# Patient Record
Sex: Male | Born: 1965 | Hispanic: No | Smoking: Never smoker
Health system: Southern US, Community
[De-identification: ages and names within clinical notes are randomized; demographics above are authoritative.]

---

## 2009-01-27 ENCOUNTER — Encounter: Admission: RE | Admit: 2009-01-27 | Discharge: 2009-01-27 | Payer: Self-pay | Admitting: Gastroenterology

## 2009-02-16 ENCOUNTER — Ambulatory Visit (HOSPITAL_COMMUNITY): Admission: RE | Admit: 2009-02-16 | Discharge: 2009-02-16 | Payer: Self-pay | Admitting: Gastroenterology

## 2011-01-03 IMAGING — NM NM HEPATO W/GB/PHARM/[PERSON_NAME]
2 series · 12 of 12 positions shown · non-contrast
Comparison: None.

CLINICAL DATA: Upper abdominal pain.

NUCLEAR MEDICINE HEPATOBILIARY IMAGING WITH GALLBLADDER EF
TECHNIQUE: Sequential images of the abdomen were obtained [DATE] minutes following intravenous administration of
radiopharmaceutical.  After slow intravenous infusion of 1.8 uCg
Cholecystokinin, gallbladder ejection fraction was determined.
Radiopharmaceutical:  5.2 mCi Choletec IV

[Series 1: he hepato · 4.71mm/px · 6 of 30 frames shown (1 of 2)]
[frame 3/30]
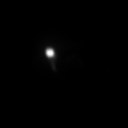
[frame 8/30]
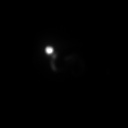
[frame 13/30]
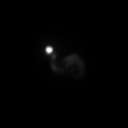
[frame 18/30]
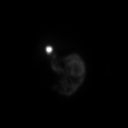
[frame 23/30]
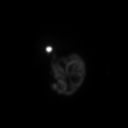
[frame 28/30]
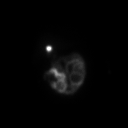

[Series 1: he hepato · 4.71mm/px · 6 of 60 frames shown (2 of 2)]
[frame 6/60]
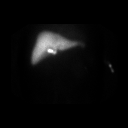
[frame 16/60]
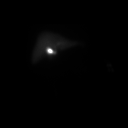
[frame 26/60]
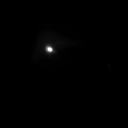
[frame 36/60]
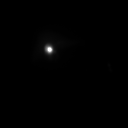
[frame 46/60]
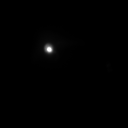
[frame 56/60]
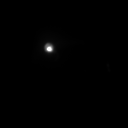

[12 of 12 positions shown; findings below may reference images not displayed]

FINDINGS: Rapid uptake of radionuclide by the liver.  Timely
appearance of the gallbladder and biliary ducts.  Small bowel
activity was noted subsequently.  The gallbladder ejection fraction
is 81.6%.  Normal ejection fraction is greater than 30%.

The patient did not experience symptoms during CCK infusion.
IMPRESSION: Patency of the cystic and biliary ducts. GB EF= 81.6%.

## 2021-11-10 ENCOUNTER — Encounter (HOSPITAL_BASED_OUTPATIENT_CLINIC_OR_DEPARTMENT_OTHER): Payer: Self-pay | Admitting: Emergency Medicine

## 2021-11-10 ENCOUNTER — Emergency Department (HOSPITAL_BASED_OUTPATIENT_CLINIC_OR_DEPARTMENT_OTHER): Payer: BC Managed Care – PPO

## 2021-11-10 ENCOUNTER — Emergency Department (HOSPITAL_BASED_OUTPATIENT_CLINIC_OR_DEPARTMENT_OTHER)
Admission: EM | Admit: 2021-11-10 | Discharge: 2021-11-10 | Disposition: A | Discharge status: Home / Self Care | Payer: BC Managed Care – PPO | Attending: Emergency Medicine | Admitting: Emergency Medicine

## 2021-11-10 ENCOUNTER — Other Ambulatory Visit: Payer: Self-pay

## 2021-11-10 DIAGNOSIS — R339 Retention of urine, unspecified: Secondary | ICD-10-CM | POA: Diagnosis present

## 2021-11-10 DIAGNOSIS — N400 Enlarged prostate without lower urinary tract symptoms: Secondary | ICD-10-CM | POA: Insufficient documentation

## 2021-11-10 LAB — CBC WITH DIFFERENTIAL/PLATELET
Abs Immature Granulocytes: 0.02 10*3/uL (ref 0.00–0.07)
Basophils Absolute: 0 10*3/uL (ref 0.0–0.1)
Basophils Relative: 1 %
Eosinophils Absolute: 0 10*3/uL (ref 0.0–0.5)
Eosinophils Relative: 1 %
HCT: 40.2 % (ref 39.0–52.0)
Hemoglobin: 14.3 g/dL (ref 13.0–17.0)
Immature Granulocytes: 1 %
Lymphocytes Relative: 19 %
Lymphs Abs: 0.8 10*3/uL (ref 0.7–4.0)
MCH: 30.4 pg (ref 26.0–34.0)
MCHC: 35.6 g/dL (ref 30.0–36.0)
MCV: 85.4 fL (ref 80.0–100.0)
Monocytes Absolute: 0.2 10*3/uL (ref 0.1–1.0)
Monocytes Relative: 5 %
Neutro Abs: 2.9 10*3/uL (ref 1.7–7.7)
Neutrophils Relative %: 73 %
Platelets: 217 10*3/uL (ref 150–400)
RBC: 4.71 MIL/uL (ref 4.22–5.81)
RDW: 13 % (ref 11.5–15.5)
WBC: 3.9 10*3/uL — ABNORMAL LOW (ref 4.0–10.5)
nRBC: 0 % (ref 0.0–0.2)

## 2021-11-10 LAB — URINALYSIS, ROUTINE W REFLEX MICROSCOPIC
Bilirubin Urine: NEGATIVE
Glucose, UA: NEGATIVE mg/dL
Ketones, ur: NEGATIVE mg/dL
Leukocytes,Ua: NEGATIVE
Nitrite: NEGATIVE
Protein, ur: NEGATIVE mg/dL
Specific Gravity, Urine: 1.015 (ref 1.005–1.030)
pH: 6.5 (ref 5.0–8.0)

## 2021-11-10 LAB — URINALYSIS, MICROSCOPIC (REFLEX)

## 2021-11-10 LAB — BASIC METABOLIC PANEL
Anion gap: 6 (ref 5–15)
BUN: 15 mg/dL (ref 6–20)
CO2: 24 mmol/L (ref 22–32)
Calcium: 9.2 mg/dL (ref 8.9–10.3)
Chloride: 110 mmol/L (ref 98–111)
Creatinine, Ser: 0.85 mg/dL (ref 0.61–1.24)
GFR, Estimated: >60 mL/min (ref >60)
Glucose, Bld: 126 mg/dL — ABNORMAL HIGH (ref 70–99)
Potassium: 3.7 mmol/L (ref 3.5–5.1)
Sodium: 140 mmol/L (ref 135–145)

## 2021-11-10 MED ORDER — TAMSULOSIN HCL 0.4 MG PO CAPS: 0.4000 mg | ORAL_CAPSULE | Freq: Every day | ORAL | 0 refills | Status: AC

## 2021-11-10 MED ORDER — TAMSULOSIN HCL 0.4 MG PO CAPS
0.4000 mg | ORAL_CAPSULE | Freq: Once | ORAL | Status: AC
  Administered 2021-11-10: 0.4 mg via ORAL
  Filled 2021-11-10: qty 1

## 2021-11-10 MED ORDER — CEFPODOXIME PROXETIL 200 MG PO TABS: 200.0000 mg | ORAL_TABLET | Freq: Two times a day (BID) | ORAL | 0 refills | Status: AC

## 2021-11-10 MED ORDER — CEFPODOXIME PROXETIL 200 MG PO TABS: 200.0000 mg | ORAL_TABLET | Freq: Two times a day (BID) | ORAL | 0 refills | Status: DC

## 2021-11-10 NOTE — ED Provider Notes (Signed)
[facility] EMERGENCY DEPARTMENT Provider Note   CSN: [id number] Arrival date & time: 11/10/21  4098     History  Chief Complaint  Patient presents with   Urinary Retention    Aaron Lowe is a 56 y.o. male.  With no reported significant past medical history who presents with polyuria and urinary retention.  He says normally he typically goes to the bathroom about 3 times a night.  He says he has a weak stream but typically feels like he is fully emptying his bladder.  Last night he tried to go the bathroom multiple times but felt like he was not able to empty or pass urine.  He denied any dysuria or hematuria or concern for STD or infection.  He has had no fevers, no back pain, no vomiting, no leg pain or weakness or recent injury.  He did have increasing lower abdominal pain with inability urinate but since getting a Foley catheter placed in the ER he feels significant relief.  He denies any history of BPH but also also never been worked up for his prostate or seen a doctor for prostate.  No history of kidney stones.  HPI     Home Medications Prior to Admission medications   Medication Sig Start Date End Date Taking? Authorizing Provider  tamsulosin (FLOMAX) 0.4 MG CAPS capsule Take 1 capsule (0.4 mg total) by mouth daily after supper for 20 days. 11/10/21 11/30/21 Yes Mardene Sayer, MD  cefpodoxime (VANTIN) 200 MG tablet Take 1 tablet (200 mg total) by mouth 2 (two) times daily for 7 days. 11/10/21 11/17/21  Mardene Sayer, MD      Allergies    Patient has no known allergies.    Review of Systems   Review of Systems  Physical Exam Updated Vital Signs BP 136/87   Pulse 75   Temp 98.3 F (36.8 C) (Oral)   Resp 16   SpO2 96%  Physical Exam Constitutional: Alert and oriented. Well appearing and in no distress. Eyes: Conjunctivae are normal. ENT      Head: Normocephalic and atraumatic.      Nose: No congestion.      Mouth/Throat: Mucous membranes are  moist.      Neck: No stridor. Cardiovascular: S1, S2,  Normal and symmetric distal pulses are present in all extremities.Warm and well perfused. Respiratory: Normal respiratory effort. Breath sounds are normal. Gastrointestinal: Soft and mildly distended but nontender, no CVA tenderness, Foley catheter in place draining clear yellow urine Musculoskeletal: Normal range of motion in all extremities. Neurologic: Normal speech and language.  Full strength bilateral lower extremities.  Sensation grossly intact.  Steady gait.  No gross focal neurologic deficits are appreciated. Skin: Skin is warm, dry and intact. No rash noted. Psychiatric: Mood and affect are normal. Speech and behavior are normal.  ED Results / Procedures / Treatments   Labs (all labs ordered are listed, but only abnormal results are displayed) Labs Reviewed  URINALYSIS, ROUTINE W REFLEX MICROSCOPIC - Abnormal; Notable for the following components:      Result Value   Hgb urine dipstick SMALL (*)    All other components within normal limits  BASIC METABOLIC PANEL - Abnormal; Notable for the following components:   Glucose, Bld 126 (*)    All other components within normal limits  CBC WITH DIFFERENTIAL/PLATELET - Abnormal; Notable for the following components:   WBC 3.9 (*)    All other components within normal limits  URINALYSIS, MICROSCOPIC (REFLEX) -  Abnormal; Notable for the following components:   Bacteria, UA RARE (*)    All other components within normal limits    EKG None  Radiology CT Renal Stone Study  Result Date: 11/10/2021 CLINICAL DATA:  Flank pain. Kidney stone suspected. New retention. Hematuria. EXAM: CT ABDOMEN AND PELVIS WITHOUT CONTRAST TECHNIQUE: Multidetector CT imaging of the abdomen and pelvis was performed following the standard protocol without IV contrast. RADIATION DOSE REDUCTION: This exam was performed according to the departmental dose-optimization program which includes automated exposure  control, adjustment of the mA and/or kV according to patient size and/or use of iterative reconstruction technique. COMPARISON:  None Available. FINDINGS: Lower chest: No acute abnormality. Hepatobiliary: No focal liver abnormality is seen. No gallstones, gallbladder wall thickening, or biliary dilatation. Pancreas: Unremarkable. No pancreatic ductal dilatation or surrounding inflammatory changes. Spleen: Normal in size without focal abnormality. Adrenals/Urinary Tract: Adrenal glands are normal. No renal stones or masses. Mild symmetric perinephric stranding. Mild pelvicaliectasis. No ureteral stones. Ureters are unremarkable. There is a Foley catheter in the bladder which is decompressed. Stomach/Bowel: The stomach and small bowel are normal. The colon is normal. Visualized appendix is normal. No evidence of appendicitis. Vascular/Lymphatic: No significant vascular findings are present. No enlarged abdominal or pelvic lymph nodes. Reproductive: The prostate appears enlarged measuring 5.2 x 5.9 by 6.1 cm. Prostate calcifications are noted. Other: No abdominal wall hernia or abnormality. No abdominopelvic ascites. Musculoskeletal: No acute or significant osseous findings. IMPRESSION: 1. No renal or ureteral stones. Mild bilateral pelvicaliectasis, nonspecific. 2. The bladder is decompressed with a Foley catheter. 3. The prostate appears enlarged measuring 5.2 x 5.9 x 6.1 cm with an estimated volume of 98 cc. 4. No other acute abnormalities. Electronically Signed   By: Dorise Bullion III M.D.   On: 11/10/2021 08:58    Procedures Procedures    Medications Ordered in ED Medications  tamsulosin (FLOMAX) capsule 0.4 mg (0.4 mg Oral Given 11/10/21 0844)    ED Course/ Medical Decision Making/ A&P                           Medical Decision Making Aaron Lowe is a 56 y.o. male.  With no reported significant past medical history who presents with polyuria and urinary retention.   Patient was bladder scan on  arrival and had retention of greater than 400 cc within his bladder.  A Foley catheter was placed with relief of symptoms.  UA was obtained with small hemoglobin 11-20 RBCs and rare bacteria with 0-5 WBCs.  No nitrite, no leukocyte esterase.  Less likely cystitis or infection leading to urinary retention but will cover with cefpodoxime bid x 7 days due to manipulation and Foley catheter placement.  Suspect most likely BPH based on age and symptoms contributing to urinary retention.  His creatinine was 0.85 with a GFR greater than 60, no concern for AKI.  No recent injuries and no focal neurologic deficits, no concern for spinal cord etiology.  Due to hematuria on UA, a CT scan was obtained which showed large prostate no kidney stones.  Patient was discharged with referral to urology and information for urology outpatient follow-up.  He was given daily Flomax prescription and 7-day cefpodoxime bid.  Foley care and teaching was provided.  Strict return precaution discussed.  Safe for discharge.  Amount and/or Complexity of Data Reviewed Labs: ordered. Radiology: ordered.  Risk Prescription drug management.    Final Clinical Impression(s) / ED Diagnoses Final  diagnoses:  Urinary retention  Enlarged prostate    Rx / DC Orders ED Discharge Orders          Ordered    Ambulatory referral to Urology        11/10/21 0837    tamsulosin (FLOMAX) 0.4 MG CAPS capsule  Daily after supper        11/10/21 0838    cefpodoxime (VANTIN) 200 MG tablet  2 times daily,   Status:  Discontinued        11/10/21 0838    cefpodoxime (VANTIN) 200 MG tablet  2 times daily        11/10/21 0840              Mardene Sayer, MD 11/10/21 1840

## 2021-11-10 NOTE — ED Triage Notes (Signed)
Pt reports he has been unable to urinate for the past 4 hours.

## 2021-11-10 NOTE — Discharge Instructions (Addendum)
You were seen in the emergency department today for urinary retention.  Your CT scan showed no kidney stone but did show evidence of an enlarged prostate which is likely leading to your urinary retention.  Take the antibiotics and Flomax as prescribed and call the urology doctors listed in your discharge paperwork to make an appointment within the next 1 week.  Let them know that you are seen in the ER for these reasons and had a Foley catheter placed.  Come back for any severe worsening pain, blockage of urine drainage from the Foley catheter, high fevers, or any other symptoms concerning to you.

## 2022-01-18 ENCOUNTER — Encounter (HOSPITAL_BASED_OUTPATIENT_CLINIC_OR_DEPARTMENT_OTHER): Payer: Self-pay | Admitting: Emergency Medicine

## 2022-01-18 ENCOUNTER — Other Ambulatory Visit: Payer: Self-pay

## 2022-01-18 ENCOUNTER — Emergency Department (HOSPITAL_BASED_OUTPATIENT_CLINIC_OR_DEPARTMENT_OTHER)
Admission: EM | Admit: 2022-01-18 | Discharge: 2022-01-18 | Disposition: A | Discharge status: Home / Self Care | Payer: BC Managed Care – PPO | Attending: Student | Admitting: Student

## 2022-01-18 DIAGNOSIS — R339 Retention of urine, unspecified: Secondary | ICD-10-CM | POA: Diagnosis present

## 2022-01-18 LAB — URINALYSIS, ROUTINE W REFLEX MICROSCOPIC
Bilirubin Urine: NEGATIVE
Glucose, UA: NEGATIVE mg/dL
Ketones, ur: NEGATIVE mg/dL
Leukocytes,Ua: NEGATIVE
Nitrite: NEGATIVE
Protein, ur: NEGATIVE mg/dL
Specific Gravity, Urine: 1.015 (ref 1.005–1.030)
pH: 6.5 (ref 5.0–8.0)

## 2022-01-18 LAB — URINALYSIS, MICROSCOPIC (REFLEX)

## 2022-01-18 NOTE — ED Provider Notes (Signed)
[facility] EMERGENCY DEPARTMENT Provider Note  CSN: [id number] Arrival date & time: 01/18/22 [id number]  Chief Complaint(s) Urinary Retention  HPI Aaron Lowe is a 56 y.o. male with PMH BPH, urinary retention requiring previous Foley catheter placement who presents emergency department for evaluation of abdominal pain and urinary retention.  Patient states he feels like he has been unable to urinate for approximately 3 hours.  Denies chest pain, shortness of breath, nausea, vomiting or other systemic symptoms.   Past Medical History History reviewed. No pertinent past medical history. There are no problems to display for this patient.  Home Medication(s) Prior to Admission medications   Not on File                                                                                                                                    Past Surgical History History reviewed. No pertinent surgical history. Family History History reviewed. No pertinent family history.  Social History Social History   Tobacco Use   Smoking status: Never   Smokeless tobacco: Never   Allergies Patient has no known allergies.  Review of Systems Review of Systems  Gastrointestinal:  Positive for abdominal pain.  Genitourinary:  Positive for decreased urine volume.    Physical Exam Vital Signs  I have reviewed the triage vital signs BP 139/85   Pulse 83   Temp 98.5 F (36.9 C)   Resp (!) 22   Ht 5\' 6"  (1.676 m)   Wt 83.9 kg   SpO2 93%   BMI 29.86 kg/m   Physical Exam Constitutional:      General: He is in acute distress.     Appearance: Normal appearance. He is ill-appearing.  HENT:     Head: Normocephalic and atraumatic.     Nose: No congestion or rhinorrhea.  Eyes:     General:        Right eye: No discharge.        Left eye: No discharge.     Extraocular Movements: Extraocular movements intact.     Pupils: Pupils are equal, round, and reactive to light.  Cardiovascular:      Rate and Rhythm: Normal rate and regular rhythm.     Heart sounds: No murmur heard. Pulmonary:     Effort: No respiratory distress.     Breath sounds: No wheezing or rales.  Abdominal:     General: There is distension.     Tenderness: There is abdominal tenderness.  Musculoskeletal:        General: Normal range of motion.     Cervical back: Normal range of motion.  Skin:    General: Skin is warm and dry.  Neurological:     General: No focal deficit present.     Mental Status: He is alert.     ED Results and Treatments Labs (all labs ordered are listed, but only abnormal results are displayed) Labs Reviewed  URINALYSIS, ROUTINE W REFLEX MICROSCOPIC - Abnormal; Notable for the following components:      Result Value   Hgb urine dipstick MODERATE (*)    All other components within normal limits  URINALYSIS, MICROSCOPIC (REFLEX) - Abnormal; Notable for the following components:   Bacteria, UA FEW (*)    All other components within normal limits  URINE CULTURE                                                                                                                          Radiology No results found.  Pertinent labs & imaging results that were available during my care of the patient were reviewed by me and considered in my medical decision making (see MDM for details).  Medications Ordered in ED Medications - No data to display                                                                                                                                   Procedures Ultrasound ED Abd  Date/Time: 01/18/2022 7:50 AM  Performed by: Glendora Score, MD Authorized by: Glendora Score, MD   Procedure details:    Indications: decreased urinary output     Bladder:  Visualized        Bladder findings:    Free pelvic fluid: not identified     Volume:  ~650    (including critical care time)  Medical Decision Making / ED Course   This patient presents to the ED for  concern of urinary retention, this involves an extensive number of treatment options, and is a complaint that carries with it a high risk of complications and morbidity.  The differential diagnosis includes BPH, urethral stricture, bladder stone, malignancy  MDM: Patient seen emergency room for evaluation of urinary retention.  Physical exam reveals an ill-appearing uncomfortable patient with abdominal fullness in the suprapubic region.  Bedside ultrasound revealing approximately 650 cc of retained urine.  Foley catheter placed with approximately 800 cc of retained urine evacuated.  Urinalysis with no evidence of infection.  Mild hematuria likely from traumatic Foley insertion.  I sent a message to his primary urologist to help facilitate follow-up and patient will be discharged with leg bag and outpatient urology follow-up.   Additional history obtained: -Additional history obtained from wife -External records from outside source obtained and reviewed including: Chart review including previous notes, labs, imaging, consultation notes  Lab Tests: -I ordered, reviewed, and interpreted labs.   The pertinent results include:   Labs Reviewed  URINALYSIS, ROUTINE W REFLEX MICROSCOPIC - Abnormal; Notable for the following components:      Result Value   Hgb urine dipstick MODERATE (*)    All other components within normal limits  URINALYSIS, MICROSCOPIC (REFLEX) - Abnormal; Notable for the following components:   Bacteria, UA FEW (*)    All other components within normal limits  URINE CULTURE      Medicines ordered and prescription drug management: No orders of the defined types were placed in this encounter.   -I have reviewed the patients home medicines and have made adjustments as needed  Critical interventions none    Cardiac Monitoring: The patient was maintained on a cardiac monitor.  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 No pertinent past medical history.    Dispostion: I considered admission for this patient, but he does not meet inpatient criteria for admission and is safe for discharge to outpatient follow-up     Final Clinical Impression(s) / ED Diagnoses Final diagnoses:  Urinary retention     @PCDICTATION @    Teressa Lower, MD 01/18/22 [phone]

## 2022-01-18 NOTE — ED Notes (Signed)
Bladder scan shows

## 2022-01-18 NOTE — ED Triage Notes (Signed)
Pt to ER with c/o urinary retention, states urinated approximately 3 hours ago, but did not empty at that time.  Pt has hx of retention with foley placement.

## 2022-01-19 LAB — URINE CULTURE: Culture: NO GROWTH

## 2022-01-27 ENCOUNTER — Other Ambulatory Visit: Payer: Self-pay

## 2022-01-27 ENCOUNTER — Encounter (HOSPITAL_BASED_OUTPATIENT_CLINIC_OR_DEPARTMENT_OTHER): Payer: Self-pay | Admitting: Emergency Medicine

## 2022-01-27 ENCOUNTER — Emergency Department (HOSPITAL_BASED_OUTPATIENT_CLINIC_OR_DEPARTMENT_OTHER)
Admission: EM | Admit: 2022-01-27 | Discharge: 2022-01-27 | Disposition: A | Discharge status: Home / Self Care | Payer: BC Managed Care – PPO | Attending: Emergency Medicine | Admitting: Emergency Medicine

## 2022-01-27 DIAGNOSIS — R300 Dysuria: Secondary | ICD-10-CM | POA: Diagnosis present

## 2022-01-27 DIAGNOSIS — N3090 Cystitis, unspecified without hematuria: Secondary | ICD-10-CM

## 2022-01-27 LAB — URINALYSIS, MICROSCOPIC (REFLEX): RBC / HPF: >50 RBC/hpf (ref 0–5)

## 2022-01-27 LAB — URINALYSIS, ROUTINE W REFLEX MICROSCOPIC
Bilirubin Urine: NEGATIVE
Glucose, UA: NEGATIVE mg/dL
Ketones, ur: NEGATIVE mg/dL
Nitrite: POSITIVE — AB
Protein, ur: 30 mg/dL — AB
Specific Gravity, Urine: 1.025 (ref 1.005–1.030)
pH: 5.5 (ref 5.0–8.0)

## 2022-01-27 MED ORDER — LIDOCAINE HCL (PF) 1 % IJ SOLN
INTRAMUSCULAR | Status: AC
  Administered 2022-01-27: 2 mL
  Filled 2022-01-27: qty 5

## 2022-01-27 MED ORDER — CEPHALEXIN 500 MG PO CAPS: 500.0000 mg | ORAL_CAPSULE | Freq: Four times a day (QID) | ORAL | 0 refills | Status: AC

## 2022-01-27 MED ORDER — CEFTRIAXONE SODIUM 1 G IJ SOLR
1.0000 g | Freq: Once | INTRAMUSCULAR | Status: AC
  Administered 2022-01-27: 1 g via INTRAMUSCULAR
  Filled 2022-01-27: qty 10

## 2022-01-27 NOTE — ED Triage Notes (Signed)
Pt had a catheter placed 12/1. He states today he has had burning and dark urine. He believes he has had fluid intake per his usual.

## 2022-01-27 NOTE — ED Notes (Signed)
Urine collected from catheter tubing.

## 2022-01-27 NOTE — ED Provider Notes (Signed)
[facility] EMERGENCY DEPARTMENT Provider Note   CSN: [id number] Arrival date & time: 01/27/22  0035     History  Chief Complaint  Patient presents with   Dysuria    Aaron Lowe is a 56 y.o. male.  The history is provided by the patient.  Dysuria Presenting symptoms: dysuria   Context: spontaneously   Relieved by:  Nothing Worsened by:  Nothing Ineffective treatments:  None tried Associated symptoms: no fever and no urinary incontinence   Risk factors comment:  Indwelling catheter Patient with suprapubic pain dysuria and dark urine tonight.       Home Medications Prior to Admission medications   Medication Sig Start Date End Date Taking? Authorizing Provider  cephALEXin (KEFLEX) 500 MG capsule Take 1 capsule (500 mg total) by mouth 4 (four) times daily. 01/27/22  Yes Aleksa Collinsworth, MD      Allergies    Patient has no known allergies.    Review of Systems   Review of Systems  Constitutional:  Negative for fever.  HENT:  Negative for facial swelling.   Eyes:  Negative for redness.  Respiratory:  Negative for wheezing and stridor.   Cardiovascular:  Negative for leg swelling.  Genitourinary:  Positive for dysuria. Negative for bladder incontinence.  All other systems reviewed and are negative.   Physical Exam Updated Vital Signs BP (!) 152/88 (BP Location: Right Arm)   Pulse 96   Temp 99.2 F (37.3 C) (Oral)   Resp 18   Ht 5\' 6"  (1.676 m)   Wt 83.9 kg   SpO2 98%   BMI 29.86 kg/m  Physical Exam Vitals and nursing note reviewed.  Constitutional:      General: He is not in acute distress.    Appearance: Normal appearance. He is well-developed. He is not diaphoretic.  HENT:     Head: Normocephalic and atraumatic.     Nose: Nose normal.  Eyes:     Conjunctiva/sclera: Conjunctivae normal.     Pupils: Pupils are equal, round, and reactive to light.  Cardiovascular:     Rate and Rhythm: Normal rate and regular rhythm.     Pulses: Normal  pulses.     Heart sounds: Normal heart sounds.  Pulmonary:     Effort: Pulmonary effort is normal.     Breath sounds: Normal breath sounds. No wheezing or rales.  Abdominal:     General: Bowel sounds are normal.     Palpations: Abdomen is soft.     Tenderness: There is no abdominal tenderness. There is no guarding or rebound.  Musculoskeletal:        General: Normal range of motion.     Cervical back: Normal range of motion and neck supple.  Skin:    General: Skin is warm and dry.     Capillary Refill: Capillary refill takes less than 2 seconds.  Neurological:     General: No focal deficit present.     Mental Status: He is alert and oriented to person, place, and time.  Psychiatric:        Mood and Affect: Mood normal.        Behavior: Behavior normal.     ED Results / Procedures / Treatments   Labs (all labs ordered are listed, but only abnormal results are displayed) Results for orders placed or performed during the hospital encounter of 01/27/22  Urinalysis, Routine w reflex microscopic Urine, Catheterized  Result Value Ref Range   Color, Urine YELLOW YELLOW  APPearance TURBID (A) CLEAR   Specific Gravity, Urine 1.025 1.005 - 1.030   pH 5.5 5.0 - 8.0   Glucose, UA NEGATIVE NEGATIVE mg/dL   Hgb urine dipstick LARGE (A) NEGATIVE   Bilirubin Urine NEGATIVE NEGATIVE   Ketones, ur NEGATIVE NEGATIVE mg/dL   Protein, ur 30 (A) NEGATIVE mg/dL   Nitrite POSITIVE (A) NEGATIVE   Leukocytes,Ua SMALL (A) NEGATIVE  Urinalysis, Microscopic (reflex)  Result Value Ref Range   RBC / HPF >50 0 - 5 RBC/hpf   WBC, UA 6-10 0 - 5 WBC/hpf   Bacteria, UA MANY (A) NONE SEEN   Squamous Epithelial / LPF 0-5 0 - 5   Mucus PRESENT    Ca Oxalate Crys, UA PRESENT    No results found.  None  Radiology No results found.  Procedures Procedures    Medications Ordered in ED Medications  cefTRIAXone (ROCEPHIN) injection 1 g (1 g Intramuscular Given 01/27/22 0244)  lidocaine (PF)  (XYLOCAINE) 1 % injection (2 mLs  Given 01/27/22 0244)    ED Course/ Medical Decision Making/ A&P                           Medical Decision Making Indwlling foley catheter scheduled to be removed Tuesday with dark urine and pain   Amount and/or Complexity of Data Reviewed Independent Historian: spouse    Details: See above  Labs: ordered.    Details: Urine is positive for UTI.  Culture sent   Risk Prescription drug management. Risk Details: Patient is very well appearing.  Urine is flowing normally.  No signs of sepsis.  Rocephin given in the ED for UTI and keflex to be started as an outpatient.  Stable for discharge.  Strict return.      Final Clinical Impression(s) / ED Diagnoses Final diagnoses:  None   Return for intractable cough, coughing up blood, fevers > 100.4 unrelieved by medication, shortness of breath, intractable vomiting, chest pain, shortness of breath, weakness, numbness, changes in speech, facial asymmetry, abdominal pain, passing out, Inability to tolerate liquids or food, cough, altered mental status or any concerns. No signs of systemic illness or infection. The patient is nontoxic-appearing on exam and vital signs are within normal limits.  I have reviewed the triage vital signs and the nursing notes. Pertinent labs & imaging results that were available during my care of the patient were reviewed by me and considered in my medical decision making (see chart for details). After history, exam, and medical workup I feel the patient has been appropriately medically screened and is safe for discharge home. Pertinent diagnoses were discussed with the patient. Patient was given return precautions.  Rx / DC Orders ED Discharge Orders          Ordered    cephALEXin (KEFLEX) 500 MG capsule  4 times daily        01/27/22 0216              Iris Hairston, MD 01/27/22 3212

## 2022-02-05 ENCOUNTER — Emergency Department (HOSPITAL_BASED_OUTPATIENT_CLINIC_OR_DEPARTMENT_OTHER)
Admission: EM | Admit: 2022-02-05 | Discharge: 2022-02-05 | Disposition: A | Discharge status: Home / Self Care | Payer: BC Managed Care – PPO | Attending: Emergency Medicine | Admitting: Emergency Medicine

## 2022-02-05 ENCOUNTER — Other Ambulatory Visit: Payer: Self-pay

## 2022-02-05 ENCOUNTER — Encounter (HOSPITAL_BASED_OUTPATIENT_CLINIC_OR_DEPARTMENT_OTHER): Payer: Self-pay | Admitting: Emergency Medicine

## 2022-02-05 DIAGNOSIS — R339 Retention of urine, unspecified: Secondary | ICD-10-CM | POA: Insufficient documentation

## 2022-02-05 LAB — URINALYSIS, ROUTINE W REFLEX MICROSCOPIC
Bilirubin Urine: NEGATIVE
Glucose, UA: NEGATIVE mg/dL
Ketones, ur: NEGATIVE mg/dL
Leukocytes,Ua: NEGATIVE
Nitrite: NEGATIVE
Protein, ur: NEGATIVE mg/dL
Specific Gravity, Urine: 1.01 (ref 1.005–1.030)
pH: 5.5 (ref 5.0–8.0)

## 2022-02-05 LAB — URINALYSIS, MICROSCOPIC (REFLEX): WBC, UA: NONE SEEN WBC/hpf (ref 0–5)

## 2022-02-05 NOTE — ED Notes (Signed)
Pt reports catheter no longer leaking at meatus.   D/c paperwork reviewed with pt. Pt provided leg bag, per his request. No further concerns or questions voiced at time of d/c. Pt ambulatory to ED exit with s/o.

## 2022-02-05 NOTE — Discharge Instructions (Signed)
Follow-up with [facility].  Dr. Orvis Brill on-call tonight but you can follow-up with the urologist that is following you.  Keep Foley catheter in place.  Give urology a call tomorrow.  Urinalysis here today is completely normal so no concerns for urinary tract infection at this time.  Return for any new or worse symptoms.

## 2022-02-05 NOTE — ED Provider Notes (Signed)
[facility] EMERGENCY DEPARTMENT Provider Note   CSN: [id number] Arrival date & time: 02/05/22  1950     History  Chief Complaint  Patient presents with   Urinary Retention    Aaron Lowe is a 56 y.o. male.  Patient feeling as if he was obstructed from a urinary standpoint.  Was having lots of cramping and discomfort.  Bladder scan showed there was only maybe about 200 to 300 cc of urine.  But since he was in so much discomfort we placed Foley catheter and got out about 400 cc.  Which is not overly distended.  However patient just had Foley catheter removed by [facility] yesterday known to have an enlarged prostate.  Knowing to have somewhat of an outlet obstruction problem.  And he is more than welcome to have the Foley catheter back in.  Also on December 10 he was treated with urinary tract infection.  Urology is planning procedures to deal with the enlarged prostate.  But that will be until the first of the year.       Home Medications Prior to Admission medications   Medication Sig Start Date End Date Taking? Authorizing Provider  cephALEXin (KEFLEX) 500 MG capsule Take 1 capsule (500 mg total) by mouth 4 (four) times daily. 01/27/22   Palumbo, April, MD      Allergies    Patient has no known allergies.    Review of Systems   Review of Systems  Constitutional:  Negative for chills and fever.  HENT:  Negative for rhinorrhea and sore throat.   Eyes:  Negative for visual disturbance.  Respiratory:  Negative for cough and shortness of breath.   Cardiovascular:  Negative for chest pain and leg swelling.  Gastrointestinal:  Negative for abdominal pain, diarrhea, nausea and vomiting.  Genitourinary:  Positive for difficulty urinating. Negative for dysuria.  Musculoskeletal:  Negative for back pain and neck pain.  Skin:  Negative for rash.  Neurological:  Negative for dizziness, light-headedness and headaches.  Hematological:  Does not bruise/bleed easily.   Psychiatric/Behavioral:  Negative for confusion.     Physical Exam Updated Vital Signs BP 125/75   Pulse 76   Temp (!) 97.5 F (36.4 C)   Resp 14   Ht 1.676 m (5\' 6" )   Wt 85.7 kg   SpO2 99%   BMI 30.51 kg/m  Physical Exam Vitals and nursing note reviewed.  Constitutional:      General: He is not in acute distress.    Appearance: Normal appearance. He is well-developed.  HENT:     Head: Normocephalic and atraumatic.     Mouth/Throat:     Mouth: Mucous membranes are moist.  Eyes:     Extraocular Movements: Extraocular movements intact.     Conjunctiva/sclera: Conjunctivae normal.     Pupils: Pupils are equal, round, and reactive to light.  Cardiovascular:     Rate and Rhythm: Normal rate and regular rhythm.     Heart sounds: No murmur heard. Pulmonary:     Effort: Pulmonary effort is normal. No respiratory distress.     Breath sounds: Normal breath sounds.  Abdominal:     General: There is no distension.     Palpations: Abdomen is soft.     Tenderness: There is no abdominal tenderness. There is no guarding.  Genitourinary:    Penis: Normal.   Musculoskeletal:        General: No swelling.     Cervical back: Normal range of motion  and neck supple.  Skin:    General: Skin is warm and dry.     Capillary Refill: Capillary refill takes less than 2 seconds.  Neurological:     General: No focal deficit present.     Mental Status: He is alert and oriented to person, place, and time.     Cranial Nerves: No cranial nerve deficit.     Sensory: No sensory deficit.     Motor: No weakness.  Psychiatric:        Mood and Affect: Mood normal.     ED Results / Procedures / Treatments   Labs (all labs ordered are listed, but only abnormal results are displayed) Labs Reviewed  URINALYSIS, ROUTINE W REFLEX MICROSCOPIC - Abnormal; Notable for the following components:      Result Value   Hgb urine dipstick SMALL (*)    All other components within normal limits  URINALYSIS,  MICROSCOPIC (REFLEX) - Abnormal; Notable for the following components:   Bacteria, UA RARE (*)    All other components within normal limits    EKG None  Radiology No results found.  Procedures Procedures    Medications Ordered in ED Medications - No data to display  ED Course/ Medical Decision Making/ A&P                           Medical Decision Making Amount and/or Complexity of Data Reviewed Labs: ordered.   Foley catheter placed patient with significant relief.  Much more comfortable.  Urinalysis here tonight without evidence of urinary tract infection no bacteria other than rare RBCs only 6-10 white blood cells not seen.  Very reassuring.  Will keep Foley catheter in place and switch over to leg bag patient will call urology for follow-up most likely will have the catheter in place and least until after Christmas.  Urology is evaluating him for enlarged prostate which is felt to be the obstructive problem. Final Clinical Impression(s) / ED Diagnoses Final diagnoses:  Urinary retention    Rx / DC Orders ED Discharge Orders     None         Vanetta Mulders, MD 02/05/22 2336

## 2022-02-05 NOTE — ED Notes (Addendum)
Pt reported leaking at meatus of penis.  EDP Zackowski made aware, verbal order to deflate and reinflate catheter balloon to try and reposition foley catheter tip and stop urine leakage.  RN deflated foley catheter balloon, emptying 10 mL NaCl, balloon reinflated with 10 mL sterile saline. Will continue to monitor.

## 2022-02-05 NOTE — ED Triage Notes (Signed)
Pt reports unable to urinate since 1730; had  foley catheter removed yesterday
